# Patient Record
Sex: Male | Born: 1990 | Hispanic: No | Marital: Single | Smoking: Former smoker
Health system: Southern US, Community
[De-identification: ages and names within clinical notes are randomized; demographics above are authoritative.]

## PROBLEM LIST (undated history)

## (undated) DIAGNOSIS — B353 Tinea pedis: Secondary | ICD-10-CM

## (undated) DIAGNOSIS — L218 Other seborrheic dermatitis: Secondary | ICD-10-CM

## (undated) DIAGNOSIS — R200 Anesthesia of skin: Secondary | ICD-10-CM

## (undated) DIAGNOSIS — L738 Other specified follicular disorders: Secondary | ICD-10-CM

## (undated) DIAGNOSIS — R202 Paresthesia of skin: Secondary | ICD-10-CM

## (undated) HISTORY — DX: Other specified follicular disorders: L73.8

## (undated) HISTORY — DX: Tinea pedis: B35.3

## (undated) HISTORY — DX: Other seborrheic dermatitis: L21.8

## (undated) HISTORY — DX: Paresthesia of skin: R20.2

## (undated) HISTORY — DX: Anesthesia of skin: R20.0

---
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
## 2011-03-13 ENCOUNTER — Ambulatory Visit: Payer: Self-pay | Admitting: Family Medicine

## 2011-03-15 ENCOUNTER — Ambulatory Visit: Payer: Self-pay | Admitting: Family Medicine

## 2011-03-22 ENCOUNTER — Ambulatory Visit: Payer: Self-pay | Admitting: Family Medicine

## 2011-04-08 LAB — CBC
HCT: 33.8 — ABNORMAL LOW
Platelets: 178
WBC: 8.9

## 2011-04-08 LAB — URINALYSIS, ROUTINE W REFLEX MICROSCOPIC
Glucose, UA: NEGATIVE
Hgb urine dipstick: NEGATIVE
Ketones, ur: NEGATIVE
Protein, ur: NEGATIVE
pH: 6

## 2011-04-08 LAB — DIFFERENTIAL
Lymphocytes Relative: 10 — ABNORMAL LOW
Lymphs Abs: 0.9 — ABNORMAL LOW
Neutro Abs: 7.8
Neutrophils Relative %: 88 — ABNORMAL HIGH

## 2011-04-08 LAB — COMPREHENSIVE METABOLIC PANEL
ALT: 33
AST: 38 — ABNORMAL HIGH
Albumin: 3.5
CO2: 24
Chloride: 107
Potassium: 4.1
Sodium: 136
Total Bilirubin: 0.9

## 2011-04-08 LAB — MISCELLANEOUS TEST

## 2011-04-08 LAB — GLUCOSE, CAPILLARY

## 2011-09-06 ENCOUNTER — Encounter: Payer: Self-pay | Admitting: Family Medicine

## 2011-09-12 ENCOUNTER — Ambulatory Visit (INDEPENDENT_AMBULATORY_CARE_PROVIDER_SITE_OTHER): Payer: Medicaid Other | Admitting: Family Medicine

## 2011-09-12 ENCOUNTER — Encounter: Payer: Self-pay | Admitting: Family Medicine

## 2011-09-12 ENCOUNTER — Telehealth: Payer: Self-pay | Admitting: Family Medicine

## 2011-09-12 VITALS — BP 110/70 | HR 72 | Ht 63.0 in | Wt 150.9 lb

## 2011-09-12 DIAGNOSIS — L709 Acne, unspecified: Secondary | ICD-10-CM

## 2011-09-12 DIAGNOSIS — L708 Other acne: Secondary | ICD-10-CM

## 2011-09-12 NOTE — Telephone Encounter (Signed)
PT CAME IN AND STATED THAT HE WAS REFERRED TO THE WRONG DERMATOLOGIST. STATES HE WOULD LIKE AN REFERRAL SENT TO [facility] [phone]. WOULD PREFER A Friday AM APPT IF POSSIBLE. PLEASE CALL PT WITH ANY OTHER QUESTIONS.

## 2011-09-12 NOTE — Telephone Encounter (Signed)
appt with d.anderson 10-04-11 at 10 am. Called pt and spoke with pt's mom. Informed of the appt and she will let him know. Also advised to arrive 15 min. Before appt. Lorenda Hatchet, Renato Battles

## 2011-09-12 NOTE — Progress Notes (Signed)
  Subjective:    Patient ID: Brian Cross, male    DOB: 04-07-1991, 21 y.o.   MRN: [id number]  HPI Patient presents today with chief complaint of acne recurrence. Patient states he was previously treated for this back in high school with Accutane. Prior to this patient was treated on medication such as tetracycline and benzoyl peroxide which were not effective. Patient states Accutane was very effective however he was also heavily drinking at the time and patient states that he had to stop medication because his accutane levels came  back to high. Patient states after the medication was discontinued at acne return in a much more aggressive  fashion. Patient is now sophomore college and generally feels that the acne is been very debilitating. Patient states that he relies air in his ways and like to cut back on Accutane again. Patient states that he is no longer drinking.   Review of Systems See HPI, otherwise ROS negative.     Objective:   Physical Exam Gen: in bed, NAD SKIN: Diffuse erythematous papules and pustules across cheeks and forehead.        Assessment & Plan:

## 2011-09-13 NOTE — Assessment & Plan Note (Signed)
Formal referral made back to Derm to assess this issue.

## 2011-10-01 ENCOUNTER — Telehealth: Payer: Self-pay | Admitting: Family Medicine

## 2011-10-01 NOTE — Telephone Encounter (Signed)
Called pt's dad and informed, that I have faxed 4 immunization records for him. Confirmed the number. Told him, that I will also mail the records to him. Lorenda Hatchet, Renato Battles

## 2011-10-01 NOTE — Telephone Encounter (Signed)
Needs shot records for all his children to take for his taxes - needs them faxed to [phone]   Also for: Brian Cross - dob 10/15/00 Brian Cross- dob 09/11/01 Brian Cross - dob 07/30/10

## 2012-03-04 ENCOUNTER — Encounter: Payer: Self-pay | Admitting: Family Medicine

## 2012-03-04 ENCOUNTER — Ambulatory Visit (INDEPENDENT_AMBULATORY_CARE_PROVIDER_SITE_OTHER): Payer: Medicaid Other | Admitting: Family Medicine

## 2012-03-04 VITALS — BP 130/84 | HR 73 | Ht 63.0 in | Wt 158.0 lb

## 2012-03-04 DIAGNOSIS — E785 Hyperlipidemia, unspecified: Secondary | ICD-10-CM

## 2012-03-04 DIAGNOSIS — R7989 Other specified abnormal findings of blood chemistry: Secondary | ICD-10-CM

## 2012-03-04 LAB — COMPREHENSIVE METABOLIC PANEL
ALT: 36 U/L (ref 0–53)
BUN: 14 mg/dL (ref 6–23)
CO2: 26 mEq/L (ref 19–32)
Calcium: 9.6 mg/dL (ref 8.4–10.5)
Chloride: 106 mEq/L (ref 96–112)
Creat: 0.86 mg/dL (ref 0.50–1.35)
Total Bilirubin: 0.4 mg/dL (ref 0.3–1.2)

## 2012-03-04 LAB — LIPID PANEL
Cholesterol: 214 mg/dL — ABNORMAL HIGH (ref 0–200)
HDL: 33 mg/dL — ABNORMAL LOW (ref >39)
Triglycerides: 450 mg/dL — ABNORMAL HIGH (ref <150)

## 2012-03-04 NOTE — Patient Instructions (Addendum)
Thank you for coming in today, it was good to see you I am going to re check your lab work today. If this looks good we will send over a referral to [facility]. I will let you know when I get lab results back.  I would abstain from alcohol intake for now given your history of elevated liver enzymes.

## 2012-03-09 DIAGNOSIS — R7989 Other specified abnormal findings of blood chemistry: Secondary | ICD-10-CM | POA: Insufficient documentation

## 2012-03-09 NOTE — Assessment & Plan Note (Signed)
Check lipid profile today.  If elevated will likely need to begin tx.

## 2012-03-09 NOTE — Progress Notes (Signed)
  Subjective:    Patient ID: Brian Cross, male    DOB: 07-17-1990, 21 y.o.   MRN: [id number]  HPI Was asked to follow up with our clinic by dermatologist due to   1. Abnormal LFT's:  Patient states that liver enzymes were abnormal on last labwork done by Roxan Hockey.  Was planning on starting back on accutane and that is why labs were drawn.  He does have a hx of elevated LFT's in the past with negative hepatitis serology.  He does endorse to heavy EtOH intake during his time in high school but is not drinking much now.  He does eat a large variety of fatty foods.  He denies any history of iv drug use.  Has not noticed any jaundice or yellowing of eyes.    2. HLD:  History of dyslipidemia, most notably hypertriglyceridemia.  As above history of EtOH use and poor diet.  He has never been on medication for this in the past.   Review of Systems Per HPI    Objective:   Physical Exam  Constitutional:       Young male, nad    Neck: Neck supple. No thyromegaly present.  Cardiovascular: Normal rate and regular rhythm.   Pulmonary/Chest: Effort normal and breath sounds normal.  Abdominal: Soft. Bowel sounds are normal. He exhibits no distension. There is no tenderness.       No hepatomegaly.   Musculoskeletal: He exhibits no edema.  Neurological: He is alert.          Assessment & Plan:

## 2012-03-09 NOTE — Assessment & Plan Note (Signed)
History of elevated LFT"s.  States that recent labwork at derm office showed elevated liver enzymes.  Recheck today.  May have been transient related to EtOH use or fatty liver from elevated triglycerides.  Viral hepatitis serology done in April at derm office negative for HeP A, B, C

## 2012-03-13 ENCOUNTER — Encounter: Payer: Self-pay | Admitting: Family Medicine

## 2012-03-27 ENCOUNTER — Other Ambulatory Visit: Payer: Self-pay | Admitting: Family Medicine

## 2012-03-27 ENCOUNTER — Encounter: Payer: Self-pay | Admitting: Family Medicine

## 2012-03-27 ENCOUNTER — Ambulatory Visit (INDEPENDENT_AMBULATORY_CARE_PROVIDER_SITE_OTHER): Payer: Medicaid Other | Admitting: Family Medicine

## 2012-03-27 VITALS — BP 125/74 | HR 61 | Ht 63.0 in | Wt 158.2 lb

## 2012-03-27 DIAGNOSIS — L738 Other specified follicular disorders: Secondary | ICD-10-CM

## 2012-03-27 DIAGNOSIS — E781 Pure hyperglyceridemia: Secondary | ICD-10-CM | POA: Insufficient documentation

## 2012-03-27 HISTORY — DX: Other specified follicular disorders: L73.8

## 2012-03-27 MED ORDER — CLINDAMYCIN PHOS-BENZOYL PEROX 1-5 % EX GEL: Freq: Two times a day (BID) | CUTANEOUS | Status: DC

## 2012-03-27 MED ORDER — GEMFIBROZIL 600 MG PO TABS: 600.0000 mg | ORAL_TABLET | Freq: Two times a day (BID) | ORAL | Status: DC

## 2012-03-30 NOTE — Progress Notes (Signed)
  Subjective:    Patient ID: Brian Cross, male    DOB: 02/13/1991, 21 y.o.   MRN: [id number]  HPI 1. F/u labs:  Her to f/u labwork.  Previous labs showed significant elevation in triglycerides.  These have been elevated for the past two years.  Currently not on any medication.  Patient feels like his diet is generally good. He denies eating an abundance of high fat foods. He does not get regular exercise.  He does endorse mild alcohol intake, he used to drink much more a couple of years ago.  Denies increased urination or thirst.     Review of Systems Per HPI    Objective:   Physical Exam  Constitutional: He appears well-nourished. No distress.  HENT:  Head: Normocephalic and atraumatic.  Neurological: He is alert.          Assessment & Plan:

## 2012-03-30 NOTE — Assessment & Plan Note (Signed)
Cholesterol elevated but I think this is due to his moderate hypertriglyceridemia.  Will start lopid and have him return in a few months to recheck triglycerides.  Discussed how he can improve this on his on with exercise and continue to improve his diet.

## 2013-01-29 ENCOUNTER — Ambulatory Visit (INDEPENDENT_AMBULATORY_CARE_PROVIDER_SITE_OTHER): Payer: Medicaid Other | Admitting: Family Medicine

## 2013-01-29 ENCOUNTER — Encounter: Payer: Self-pay | Admitting: Family Medicine

## 2013-01-29 VITALS — BP 130/84 | HR 77 | Ht 63.0 in | Wt 161.0 lb

## 2013-01-29 DIAGNOSIS — Z00129 Encounter for routine child health examination without abnormal findings: Secondary | ICD-10-CM

## 2013-01-29 DIAGNOSIS — L708 Other acne: Secondary | ICD-10-CM

## 2013-01-29 DIAGNOSIS — B353 Tinea pedis: Secondary | ICD-10-CM | POA: Insufficient documentation

## 2013-01-29 DIAGNOSIS — E781 Pure hyperglyceridemia: Secondary | ICD-10-CM

## 2013-01-29 DIAGNOSIS — E785 Hyperlipidemia, unspecified: Secondary | ICD-10-CM

## 2013-01-29 DIAGNOSIS — Z7189 Other specified counseling: Secondary | ICD-10-CM

## 2013-01-29 DIAGNOSIS — F17200 Nicotine dependence, unspecified, uncomplicated: Secondary | ICD-10-CM

## 2013-01-29 DIAGNOSIS — Z716 Tobacco abuse counseling: Secondary | ICD-10-CM

## 2013-01-29 HISTORY — DX: Tinea pedis: B35.3

## 2013-01-29 LAB — COMPREHENSIVE METABOLIC PANEL
AST: 40 U/L — ABNORMAL HIGH (ref 0–37)
Albumin: 4.2 g/dL (ref 3.5–5.2)
Alkaline Phosphatase: 97 U/L (ref 39–117)
Potassium: 4.2 mEq/L (ref 3.5–5.3)
Sodium: 138 mEq/L (ref 135–145)
Total Protein: 7.5 g/dL (ref 6.0–8.3)

## 2013-01-29 LAB — LIPID PANEL
Total CHOL/HDL Ratio: 7.2 Ratio
VLDL: 73 mg/dL — ABNORMAL HIGH (ref 0–40)

## 2013-01-29 MED ORDER — CLOTRIMAZOLE-BETAMETHASONE 1-0.05 % EX CREA: TOPICAL_CREAM | Freq: Two times a day (BID) | CUTANEOUS | Status: DC

## 2013-01-29 NOTE — Patient Instructions (Addendum)
Thank you for coming in, today! For your cholesterol:    We will check your labs today.    I will call you and we may start a medication, then we may recheck it to see how any meds are working    Once that's done, we will get you re-referred to dermatology. For your foot:    I think this is something called tinea pedis (athlete's foot).    I will prescribe a medication called lotrisone cream. Use it twice per day until it goes away.    After that, use it once a week. For smoking:    Stopping smoking will help with lots of stuff -- it's better your heart, your lungs, your skin, and it may help with your cholesterol.    You can call 1-800-QUIT NOW, absolutely free, for advice and help stopping smoking.    I will also talk to our health coaches, who can call you and talk about different strategies.    If you would like to talk about medications to help, please give me a call. Come back to see me in about 1-2 months. Please feel free to call with any questions or concerns at any time, at [phone]. --Dr. Casper Harrison

## 2013-02-01 NOTE — Progress Notes (Signed)
  Subjective:    Patient ID: Brian Cross, male    DOB: 29-Apr-1991, 22 y.o.   MRN: [id number]  HPI: Pt presents for yearly visit and to discuss acne as well as questionable athlete's foot. Generally feels well. Pt has previously been treated by Drs. Tafeen and Lupton (derm) but has had to stop Accutane due to high cholesterol/TG. Has not seen derm in a few years.  Acne: Extensive and long-standing, previously on Accutane but stopped a few years ago due to high cholesterol/TG/impaired liver function  -pt has been on several different medications in the past but not taking anything currently  -interested in following up with dermatology again, but would like to check lipids/TG's and have medications if necessary before being re-referred  -some occasional itching of lesions, but no frank drainage or bleeding from lesions; pt very interested in treatment for cosmesis at least  Athlete's foot: Pt complains of itching and redness to webspaces between 3rd-4th and 4th-5th digits of his left foot; similar on his right foot but less extensive  -itching, redness, pain in the area, present for several days and getting worse despite using Lotrisone cream OTC  -no frank bleeding or discharge; pt has had athlete's foot in the past and thinks this is similar  Elevated cholesterol/triglycerides: Per history; last lipid panel "at least a year ago"  -pt previously prescribed Lopid but states he never took this medication because he was "never told to pick it up"  -never on statin or other medication, not currently taking anything over the counter such as fish oil or niacin  -as above, pt believes this is why he was stopped on Accutane for acne  Pt is a current smoker. He is interested in quitting. In addition to the above documentation, pt's PMH, surgical history, FH, and SH all reviewed and updated where appropriate in the EMR. I have also reviewed and updated the pt's allergies and current medications as  appropriate.  Review of Systems: As above. Otherwise, full 12-system ROS was reviewed and all negative.     Objective:   Physical Exam BP 130/84  Pulse 77  Ht 5\' 3"  (1.6 m)  Wt 161 lb (73.029 kg)  BMI 28.53 kg/m2 Gen: well-appearing young adult male in NAD HEENT: [place]/AT, sclerae/conjunctivae clear, no lid lag, EOMI, PERRLA   MMM, posterior oropharynx clear, no cervical lymphadenopathy  neck supple with full ROM, no masses appreciated; thyroid not enlarged  Diffuse whitish papular lesions to bilateral cheeks, forehead, and across nose consistent with prior documentation of severe acne  Few very small similar lesions to posterior scalp just superior to hairline, no frank drainage or bleeding Ext: bilateral feet with scaling skin and mild erythematous skin breakdown to webspaces between toes, worse on left between 3rd and 4th toes  No frank bleeding or drainage, similar appearance but less severe on right between 2nd and 3rd toes  General appearance consistent with tinea pedis Cardio: RRR, no murmur appreciated; distal pulses intact/symmetric Pulm: CTAB, no wheezes, normal WOB  Abd: soft, nondistended, BS+, no HSM Ext: warm/well-perfused, no cyanosis/clubbing/edema MSK: strength 5/5 in all four extremities, no frank joint deformity/effusion;   normal ROM to all four extremities with no point muscle/bony tenderness in spine Neuro/Psych: alert/oriented, sensation grossly intact; normal gait/balance  mood euthymic with congruent affect     Assessment & Plan:

## 2013-02-02 ENCOUNTER — Telehealth: Payer: Self-pay | Admitting: Family Medicine

## 2013-02-02 DIAGNOSIS — Z716 Tobacco abuse counseling: Secondary | ICD-10-CM | POA: Insufficient documentation

## 2013-02-02 DIAGNOSIS — E781 Pure hyperglyceridemia: Secondary | ICD-10-CM

## 2013-02-02 MED ORDER — GEMFIBROZIL 600 MG PO TABS: 600.0000 mg | ORAL_TABLET | Freq: Two times a day (BID) | ORAL | Status: DC

## 2013-02-02 NOTE — Telephone Encounter (Signed)
Called pt to discuss recent lab draw. Liver function notable only for very mild increased AST (40). TG 367 and LDL 100. Pt had been prescribed Lopid 600 mg BID in the past but has never taken this medication. Will reorder this and plan to recheck cholesterol and liver function in 2-3 months. If lipids improved will likely re-refer to dermatology for specialist management of acne (pt reported that he was stopped on medication from dermatology due to problems with liver function/TG's/cholesterol). --CMS

## 2013-02-03 NOTE — Telephone Encounter (Signed)
Pt is over 18, so he must provide permission to disclose information about his healthcare to pt's mother (I discussed this with pt's mother at a visit she was present for, for pt's younger brother; I did not specifically ask him yesterday if we could disclose information to his mother). Pt reported to me that he was seen by dermatology in the past but was stopped on Accutane due to elevated transaminases and high triglycerides/cholesterol. Pt still has very mildly elevated AST and TG's, and has been prescribed Lopid. Plan now is to wait to see how his liver function and lipid panel looks on medication in several weeks, then refer to derm (instead of referring him now, when they may want to wait to see resolution of liver function, etc, anyway). This was all discussed with pt by phone, yesterday with me. Thanks! --CMS

## 2013-02-03 NOTE — Telephone Encounter (Signed)
Patient's mother here in office and requested copy of recent labs.  Copy given.  Mother also wants to check on status of dermatology referral.  Will route note to Dr. Casper Harrison and call mother back.  Gaylene Brooks, RN

## 2013-02-03 NOTE — Assessment & Plan Note (Signed)
Mild/moderate to bilateral feet webspaces, left worse than right. Rx for Lotrisone cream. Counseled on daily foot hygiene. Follow up PRN.

## 2013-02-03 NOTE — Assessment & Plan Note (Signed)
Last lipid panel about 1 year ago, TG 450, LDL not calculated, with normal LFT's. CMP and lipid panel drawn at this visit shows very mild AST elevation at 40, LDL 100, TG 367. Rx given for Lopid to treat hypertriglyceridemia. Will plan to recheck CMP and lipid panel after medication is started for several weeks, then f/u as needed.

## 2013-02-03 NOTE — Assessment & Plan Note (Signed)
Severe and chronic in nature, last seen by derm >1 year ago. Previously on Accutane, stopped due to liver function and elevated TG/cholesterol. Not on any current medications. No evidence currently for superinfection. Will likely end up referring back to dermatology once liver function/TG/cholesterol is addressed. See also dyslipidemia problem list note.

## 2013-02-03 NOTE — Assessment & Plan Note (Signed)
Current daily smoker, interested in quitting. Counseled on general cessation strategy and referred to [facility] QUIT NOW number. Will also refer to health coach; pt in agreement to these measures. Will f/u with further counseling at next visit(s).

## 2013-02-11 ENCOUNTER — Telehealth: Payer: Self-pay | Admitting: Family Medicine

## 2013-02-11 DIAGNOSIS — E785 Hyperlipidemia, unspecified: Secondary | ICD-10-CM

## 2013-02-11 NOTE — Telephone Encounter (Signed)
Mr. Makarewicz called to ask if his cholesterol med could be change to Lovaza or Crestor because the are cheaper than the Gemfibrozil that was prescribed.  Can send to rx request to [facility] on [address]. Patient requesting call first before sending medication to pharmacy. Would like call by 10:00 in the am.

## 2013-02-11 NOTE — Telephone Encounter (Signed)
Forward to PCP to change Rx 

## 2013-02-12 MED ORDER — ATORVASTATIN CALCIUM 40 MG PO TABS: 40.0000 mg | ORAL_TABLET | Freq: Every day | ORAL | Status: DC

## 2013-02-12 NOTE — Telephone Encounter (Signed)
Called pt this morning to discuss medication changes. Crestor is certainly an option for him, but is not preferred for Medicaid. Lovaza could potentially be an option as well, but is not a preferred drug for Medicaid, either; additionally, Lovaza is more appropriate for TG >500, which pt does not have, though his TG's have been >450 in the past. After discussion with pt, Rx was sent in for generic Lipitor 40 mg. Advised pt to call back today if he has any issues with Rx, to hopefully address things before the weekend. Otherwise advised him to call first thing Monday if there are any problems. --CMS

## 2013-02-19 ENCOUNTER — Telehealth: Payer: Self-pay | Admitting: *Deleted

## 2013-02-19 NOTE — Telephone Encounter (Signed)
Uncertain why there is difficulty covering Lipitor generic (this is on the Medicaid preferred list). Prior auth completed, citing unique pt circumstances (elevated LDL and TG's, making Lipitor the most appropriate medication compared to Lopid). If this is not accepted, will consider trying to get pt in to [facility]. --CMS

## 2013-02-19 NOTE — Telephone Encounter (Signed)
Can you prescribe alternative to atorvastatin - insurance will not cover? Wyatt Haste, RN-BSN

## 2013-02-22 ENCOUNTER — Telehealth: Payer: Self-pay | Admitting: *Deleted

## 2013-02-22 NOTE — Telephone Encounter (Signed)
[place] [facility] called for prior authorization on atorvastin - pt only has [employer] MEDICAID. No drugs unless std disease realted will be covered. Pt called and informed - message left with mother. Wyatt Haste, RN-BSN

## 2014-05-01 ENCOUNTER — Emergency Department (HOSPITAL_COMMUNITY): Payer: BC Managed Care – PPO

## 2014-05-01 ENCOUNTER — Encounter (HOSPITAL_COMMUNITY): Payer: Self-pay | Admitting: Emergency Medicine

## 2014-05-01 ENCOUNTER — Emergency Department (HOSPITAL_COMMUNITY)
Admission: EM | Admit: 2014-05-01 | Discharge: 2014-05-01 | Disposition: A | Discharge status: Home / Self Care | Payer: BC Managed Care – PPO | Attending: Emergency Medicine | Admitting: Emergency Medicine

## 2014-05-01 DIAGNOSIS — M79641 Pain in right hand: Secondary | ICD-10-CM

## 2014-05-01 DIAGNOSIS — Y9289 Other specified places as the place of occurrence of the external cause: Secondary | ICD-10-CM | POA: Insufficient documentation

## 2014-05-01 DIAGNOSIS — Y9389 Activity, other specified: Secondary | ICD-10-CM | POA: Diagnosis not present

## 2014-05-01 DIAGNOSIS — Z79899 Other long term (current) drug therapy: Secondary | ICD-10-CM | POA: Insufficient documentation

## 2014-05-01 DIAGNOSIS — Z720 Tobacco use: Secondary | ICD-10-CM | POA: Diagnosis not present

## 2014-05-01 DIAGNOSIS — S6991XA Unspecified injury of right wrist, hand and finger(s), initial encounter: Secondary | ICD-10-CM | POA: Diagnosis present

## 2014-05-01 DIAGNOSIS — Z7952 Long term (current) use of systemic steroids: Secondary | ICD-10-CM | POA: Insufficient documentation

## 2014-05-01 DIAGNOSIS — X58XXXA Exposure to other specified factors, initial encounter: Secondary | ICD-10-CM | POA: Insufficient documentation

## 2014-05-01 MED ORDER — IBUPROFEN 800 MG PO TABS
800.0000 mg | ORAL_TABLET | Freq: Once | ORAL | Status: AC
  Administered 2014-05-01: 800 mg via ORAL
  Filled 2014-05-01: qty 1

## 2014-05-01 MED ORDER — NAPROXEN 500 MG PO TABS: 500.0000 mg | ORAL_TABLET | Freq: Two times a day (BID) | ORAL | Status: DC

## 2014-05-01 MED ORDER — HYDROCODONE-ACETAMINOPHEN 5-325 MG PO TABS: 1.0000 | ORAL_TABLET | Freq: Four times a day (QID) | ORAL | Status: DC | PRN

## 2014-05-01 NOTE — ED Provider Notes (Signed)
Medical screening examination/treatment/procedure(s) were performed by non-physician practitioner and as supervising physician I was immediately available for consultation/collaboration.   EKG Interpretation None      Devoria AlbeIva Dyani Babel, MD, Armando GangFACEP   Ward GivensIva L Keymarion Bearman, MD 05/01/14 2024
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
## 2014-05-01 NOTE — Discharge Instructions (Signed)
Intermetacarpal Sprain °The intermetacarpal ligaments run between the knuckles at the base of the fingers. These ligaments are vulnerable to sprain and injury in which the ligament becomes overstretched or torn. Intermetacarpal sprains are classified into 3 categories. Grade 1 sprains cause pain, but the tendon is not lengthened. Grade 2 sprains include a lengthened ligament, due to the ligament being stretched or partially ruptured. With grade 2 sprains there is still function, although function may be decreased. Grade 3 sprains include a complete tear of the ligament, and the joint usually displays a loss of function.  °SYMPTOMS  °· Severe pain at the time of injury. °· Often, a feeling of popping or tearing inside the hand. °· Tenderness and inflammation at the knuckles. °· Bruising within a couple days of injury. °· Impaired ability to use the hand. °CAUSES  °This condition occurs when the intermetacarpal ligaments are subjected to a greater stress than they can handle. This causes the ligaments to become stretched or torn. °RISK INCREASES WITH: °· Previous hand injury. °· Fighting sports (boxing, wrestling, martial arts). °· Sports in which you could fall on an outstretched hand (soccer, basketball, volleyball). °· Other sports with repeated hand trauma (water polo, gymnastics). °· Poor hand strength and flexibility. °· Inadequate or poorly fitted protective equipment. °PREVENTION  °· Warm up and stretch properly before activity. °· Maintain appropriate conditioning: °¨ Hand flexibility. °¨ Muscle strength and endurance. °· Applying tape, protective strapping, or a brace may help prevent injury. °· Provide the hand with support during sports and practice activities for 6 to 12 months following injury. °PROGNOSIS  °With proper treatment, healing should occur without impairment. The length of healing varies from 2 to 12 weeks, depending on the severity of injury. °RELATED COMPLICATIONS  °· Longer healing time, if  activities are resumed too soon. °· Recurring symptoms or repeated injury, resulting in a chronic problem. °· Injury to other nearby structures (bone, cartilage, tendon). °· Arthritis of the knuckle (intermetacarpal) joint, with repeated sprains. °· Prolonged disability (sometimes). °· Hand and finger stiffness or weakness. °TREATMENT °Treatment first involves ice and medicine to reduce pain and inflammation. An elastic compression bandage may be worn to reduce discomfort and to protect the area. Depending on the severity of injury, you may be required to restrain the area with a cast, splint, or brace. After the ligament has been allowed to heal, strengthening and stretching exercises may be needed to regain strength and a full range of motion. Exercises may be completed at home or with a therapist. Surgery is rarely needed. °MEDICATION  °· If pain medicine is needed, nonsteroidal anti-inflammatory medicines (aspirin and ibuprofen), or other minor pain relievers (acetaminophen), are often advised. °· Do not take pain medicine for 7 days before surgery. °· Stronger pain relievers may be prescribed if your caregiver thinks they are needed. Use only as directed and only as much as you need. °HEAT AND COLD °· Cold treatment (icing) should be applied for 10 to 15 minutes every 2 to 3 hours for inflammation and pain, and immediately after activity that aggravates your symptoms. Use ice packs or an ice massage. °· Heat treatment may be used before performing stretching and strengthening activities prescribed by your caregiver, physical therapist, or athletic trainer. Use a heat pack or a warm water soak. °SEEK MEDICAL CARE IF:  °· Symptoms remain or get worse, despite treatment for longer than 2 to 4 weeks. °· You experience pain, numbness, discoloration, or coldness in the hand or fingers. °·   You develop blue, gray, or dark fingernails. °· Any of the following occur after surgery: increased pain, swelling, redness,  drainage of fluids, bleeding in the affected area, or signs of infection, including fever. °· New, unexplained symptoms develop. (Drugs used in treatment may produce side effects.) °Document Released: 06/24/2005 Document Revised: 11/08/2013 Document Reviewed: 10/06/2008 °[employer]® Patient Information ©2015 [employer]. This information is not intended to replace advice given to you by your health care provider. Make sure you discuss any questions you have with your health care provider. ° °

## 2015-04-12 IMAGING — CR DG WRIST COMPLETE 3+V*R*
4 series · 4 of 4 positions shown · non-contrast
Comparison: None.

CLINICAL DATA: Right wrist injury today.  Pain.  Initial encounter.

EXAM:
RIGHT WRIST - COMPLETE 3+ VIEW

[x wrist pa right]
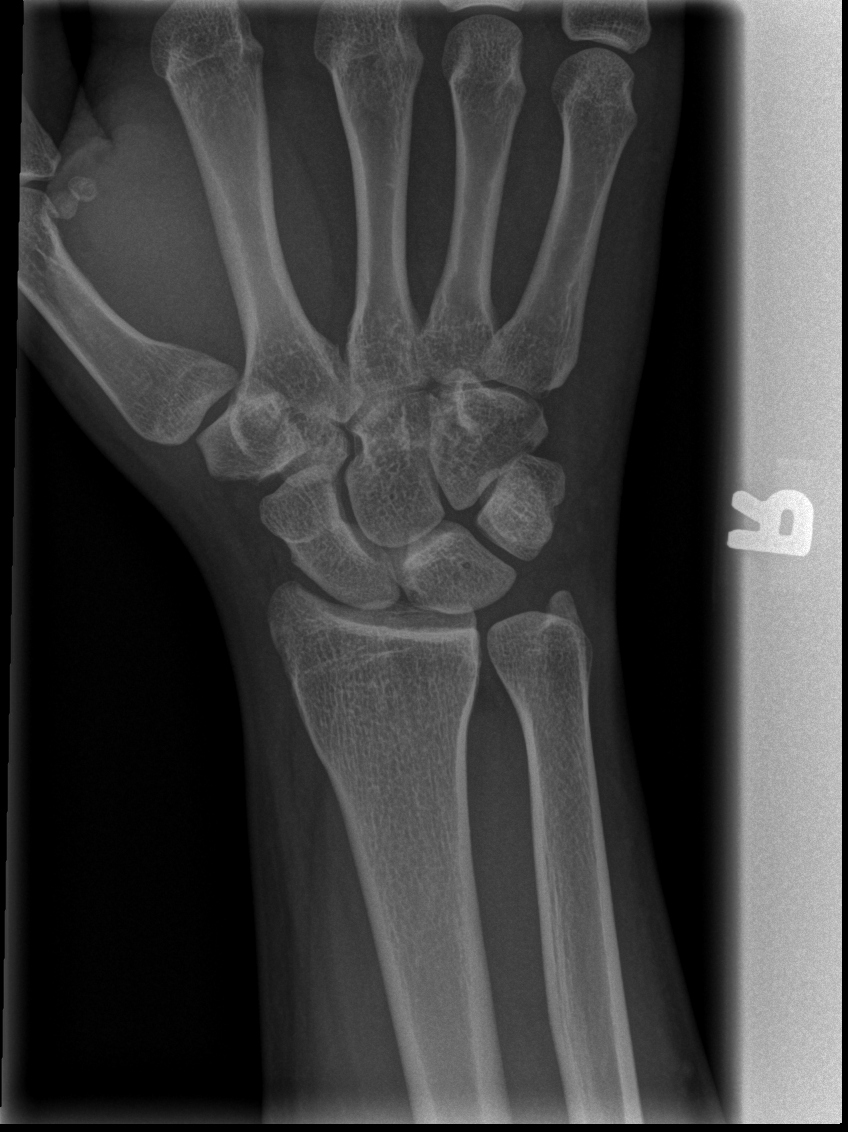

[x wrist obl right]
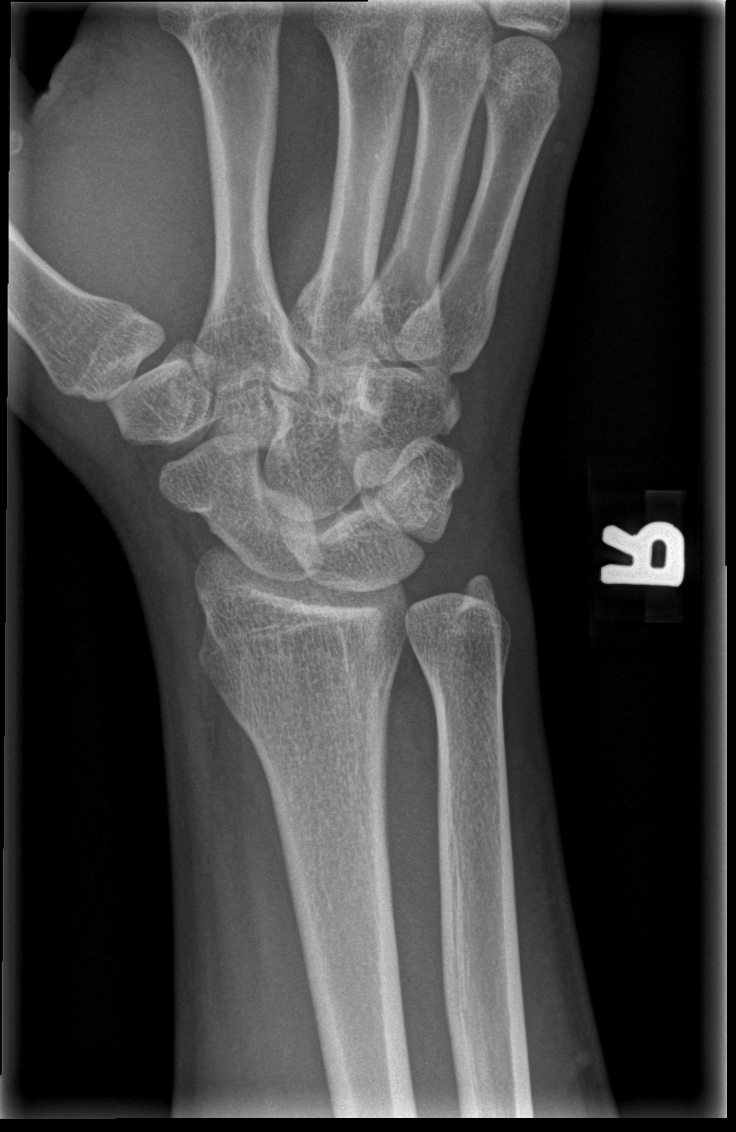

[x wrist lat right]
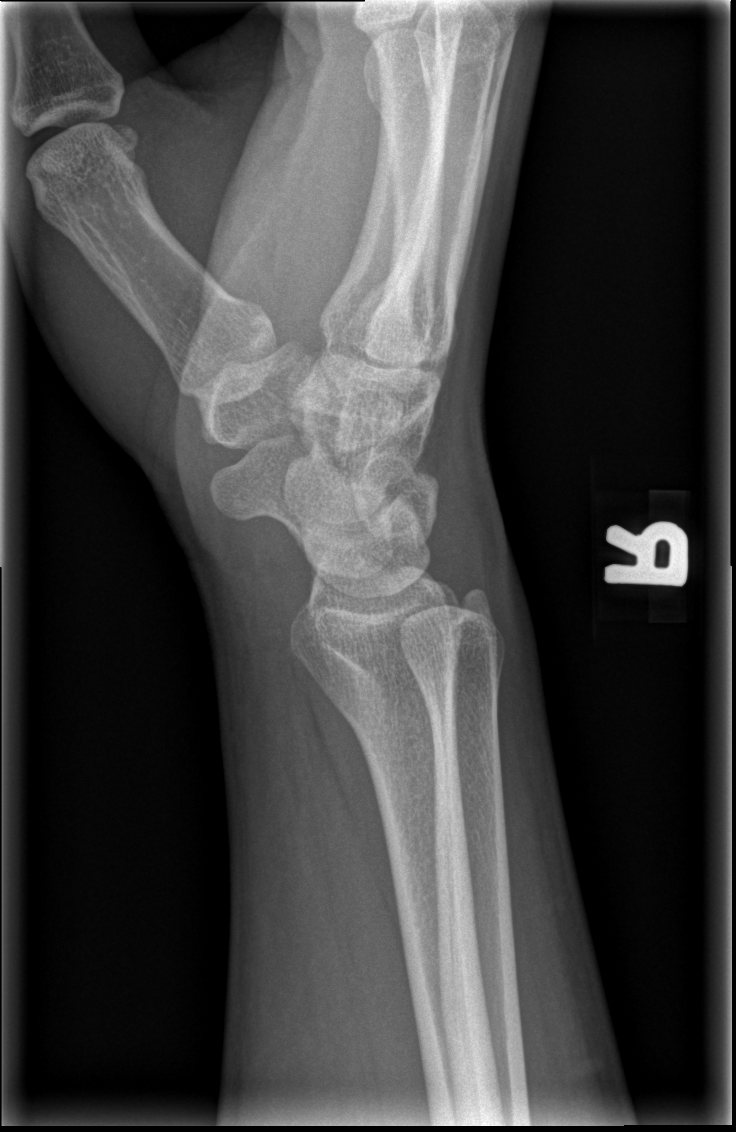

[x wrist navicular view right]
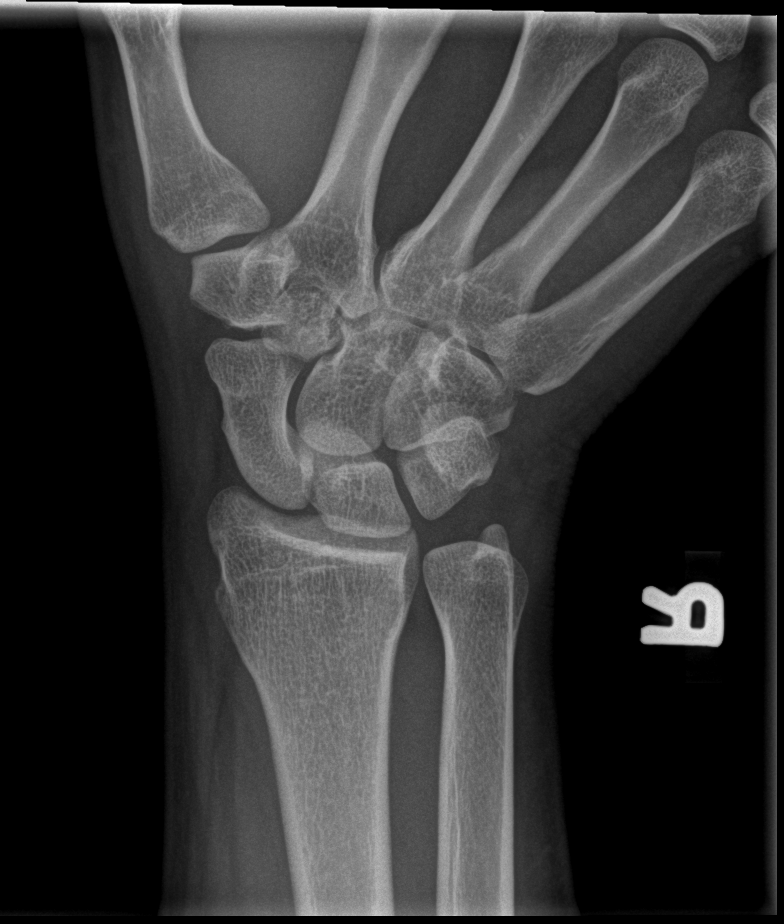

[4 of 4 positions shown; findings below may reference images not displayed]

FINDINGS: Imaged bones, joints and soft tissues appear normal.
IMPRESSION: Normal examination.

## 2015-04-12 IMAGING — CR DG HAND COMPLETE 3+V*R*
3 series · 3 of 3 positions shown · non-contrast
Comparison: None.

CLINICAL DATA: Right hand injury, swelling at base of 1st and 2nd
digits

EXAM:
RIGHT HAND - COMPLETE 3+ VIEW

[x hand pa right]
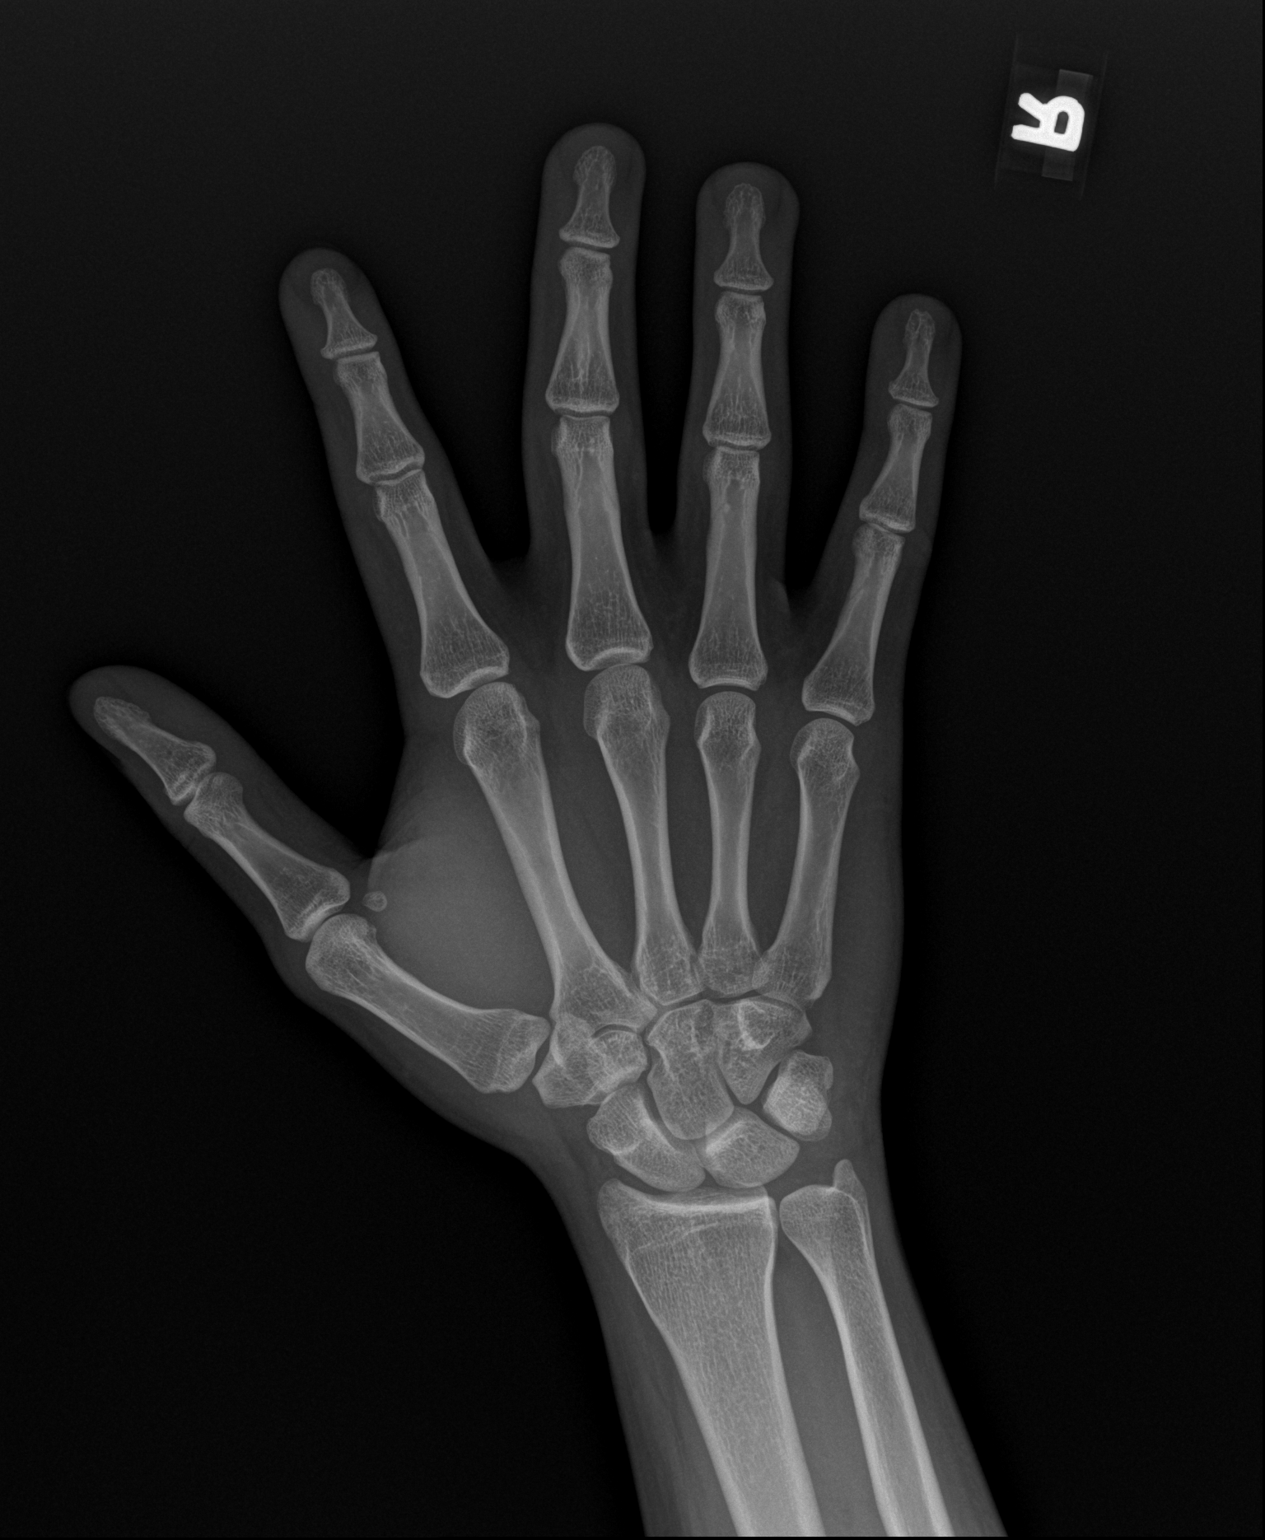

[x hand obl right]
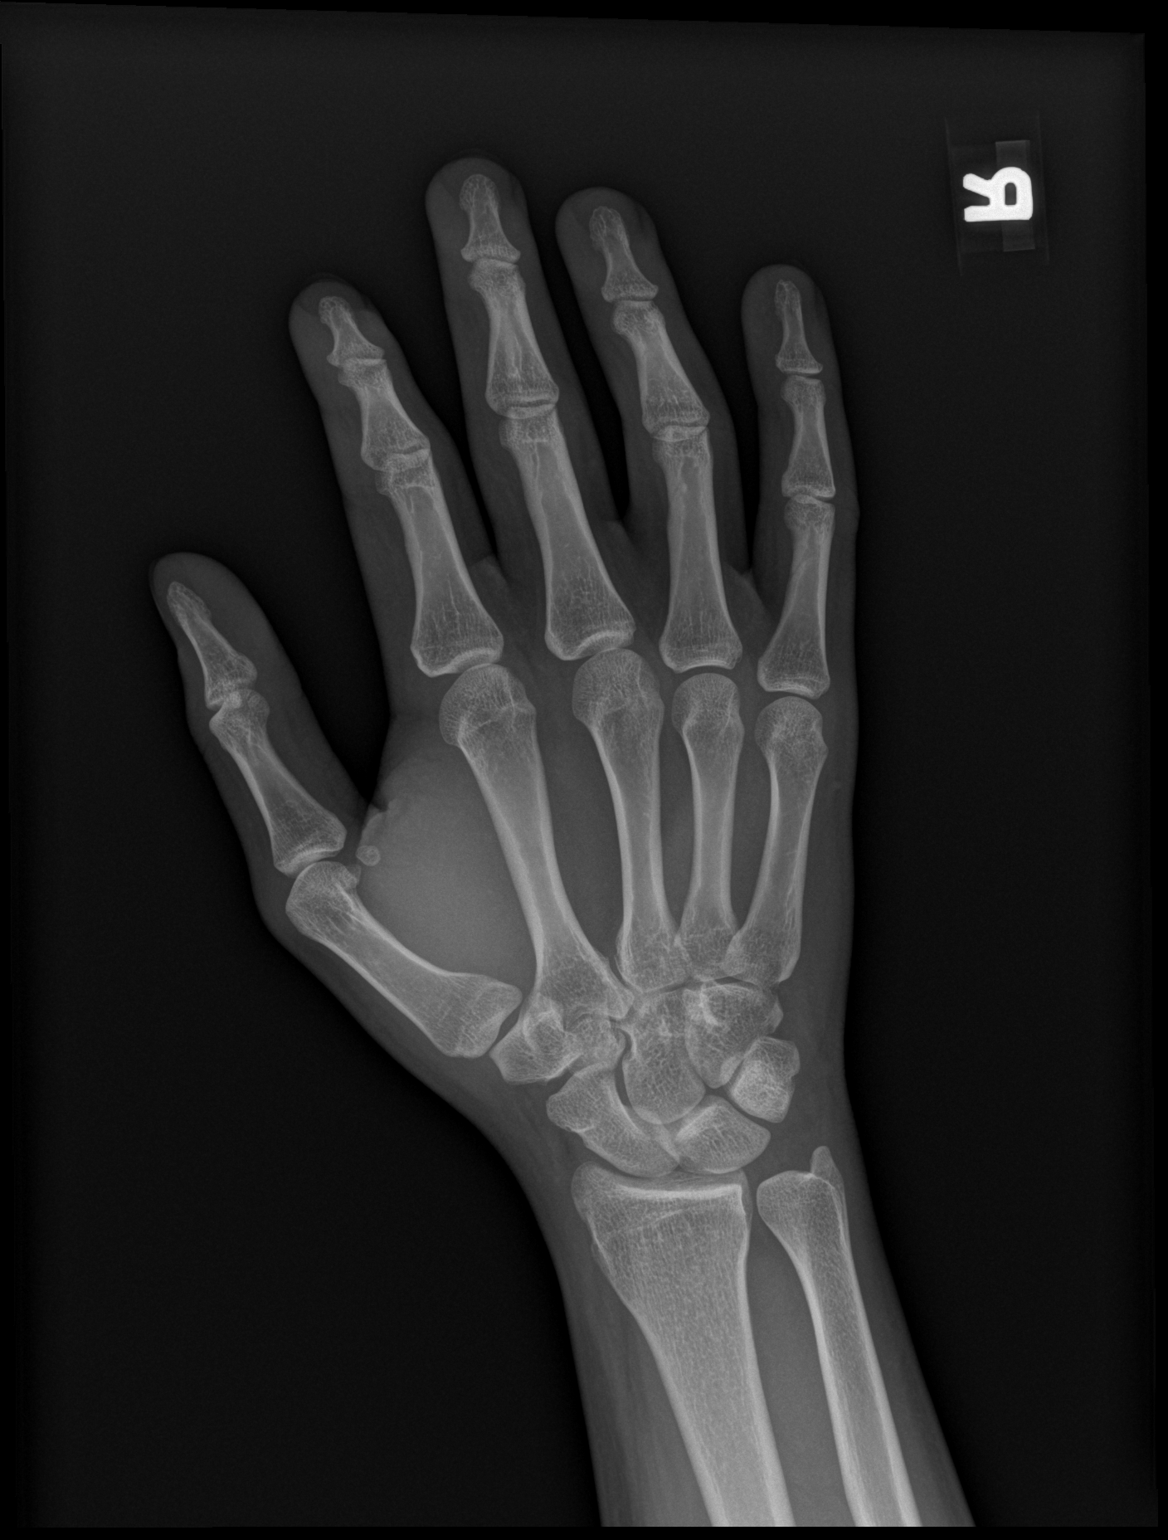

[x hand lat right]
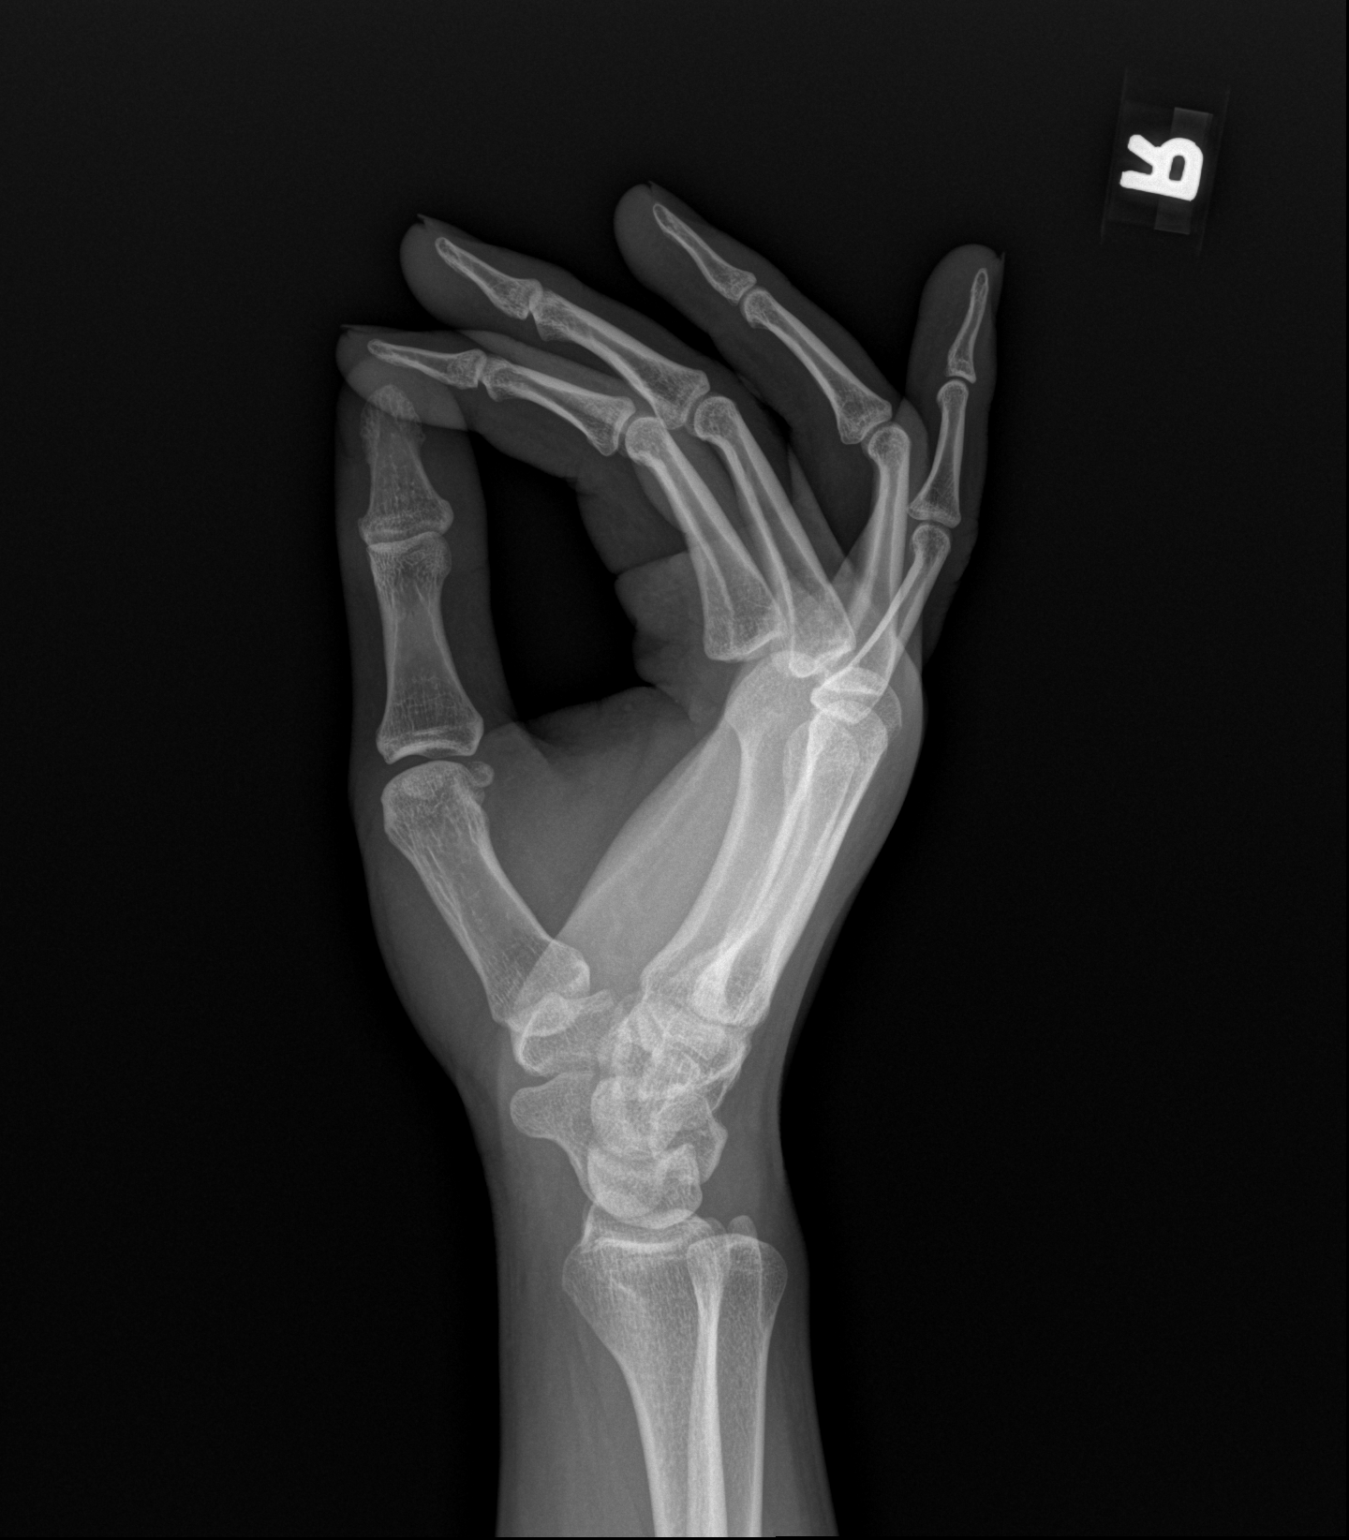

[3 of 3 positions shown; findings below may reference images not displayed]

FINDINGS: No fracture or dislocation is seen.

The joint spaces are preserved.

Mild soft tissue swelling overlying the dorsal MCP joints on the
lateral view.
IMPRESSION: No fracture or dislocation is seen.

## 2015-12-12 ENCOUNTER — Ambulatory Visit: Payer: Medicaid Other

## 2015-12-13 ENCOUNTER — Ambulatory Visit (INDEPENDENT_AMBULATORY_CARE_PROVIDER_SITE_OTHER): Payer: Self-pay | Admitting: Internal Medicine

## 2015-12-13 ENCOUNTER — Encounter: Payer: Self-pay | Admitting: Internal Medicine

## 2015-12-13 VITALS — BP 139/84 | HR 73 | Temp 98.1°F | Wt 137.8 lb

## 2015-12-13 DIAGNOSIS — K219 Gastro-esophageal reflux disease without esophagitis: Secondary | ICD-10-CM | POA: Insufficient documentation

## 2015-12-13 MED ORDER — CALCIUM CARBONATE-SIMETHICONE 500-125 MG PO CHEW: CHEWABLE_TABLET | ORAL | Status: DC

## 2015-12-13 NOTE — Assessment & Plan Note (Addendum)
Symptoms of GERD, associated with long fasts, followed by large food intake. Positive symptoms include radiating chest pain along esophagus. Burping, nausea after eating.  - patient believes associated with eating tomatoes, indicated stopping tomatoes during Ramadan - Prescribed calcuim-carbonate-simethicone tablets as needed for the nausea.  - Patient wanted EKG at this visit, [facility], counseled against this, normal Cardio exam today, no signs or symptoms of arrhthymias

## 2015-12-13 NOTE — Patient Instructions (Addendum)
Try to over the counter medication of reflux as this may help with the nausea

## 2015-12-13 NOTE — Progress Notes (Signed)
   [facility] Brian CharsAsiyah Atleigh Gruen, MD Phone: [phone]  Reason For Visit: Same day visit N/V   # Ramadan, therefore patient has been fasting during the day, eating at night after night fall.  - Indicates after breaking fast having nausea with eating, finds himself burping a lot, will have chest pain associated with this at times. He has vomited up a little about two times. He feels like these symptoms are associated with eating tomatoes, and since he stopped eating tomatoes they have resolved. Patient indicates having diarrhea, - he states his stools are watery. However usually only has 1 stool a day, at the most 2 stools in day. He is worried about his heart due to this chest pain. However, denies any syncopal events or exercise intolerance. No hx of sudden young deaths in the family. Patient is anxious about his health.   Past Medical History Reviewed problem list.  Medications- reviewed and updated No additions to family history Social history- patient is a former smoker  Objective: BP 139/84 mmHg  Pulse 73  Temp(Src) 98.1 F (36.7 C) (Oral)  Wt 137 lb 12.8 oz (62.506 kg) Gen: NAD, alert, cooperative with exam CV: RRR, good S1/S2, no murmur,  Resp: CTABL, no wheezes, non-labored Abd: SNTND, BS present, no guarding  Assessment/Plan: See problem based a/p  GERD (gastroesophageal reflux disease) Symptoms of GERD, associated with long fasts, followed by large food intake. Positive symptoms include radiating chest pain along esophagus. Burping, nausea after eating.  - patient believes associated with eating tomatoes, indicated stopping tomatoes during Ramadan - Prescribed calcuim-carbonate-simethicone tablets as needed for the nausea.  - Patient wanted EKG at this visit, [facility], counseled against this, normal Cardio exam today, no signs or symptoms of arrhthymias

## 2017-08-13 ENCOUNTER — Other Ambulatory Visit: Payer: Self-pay

## 2017-08-13 ENCOUNTER — Ambulatory Visit: Payer: BLUE CROSS/BLUE SHIELD | Admitting: Internal Medicine

## 2017-08-13 ENCOUNTER — Encounter: Payer: Self-pay | Admitting: Internal Medicine

## 2017-08-13 VITALS — BP 112/78 | HR 103 | Temp 98.0°F | Wt 140.0 lb

## 2017-08-13 DIAGNOSIS — Z0000 Encounter for general adult medical examination without abnormal findings: Secondary | ICD-10-CM

## 2017-08-13 NOTE — Patient Instructions (Signed)
It was nice meeting you today Brian Cross!  If you have any questions or concerns, please feel free to call the clinic.   Be well,  Dr. Natale MilchLancaster

## 2017-08-13 NOTE — Progress Notes (Signed)
Note entered in error.   Brian AbernethyAbigail J Jamerion Cabello, MD, MPH PGY-3 [facility] Pager [phone]

## 2017-09-01 ENCOUNTER — Encounter: Payer: Self-pay | Admitting: Internal Medicine

## 2017-09-01 ENCOUNTER — Ambulatory Visit (INDEPENDENT_AMBULATORY_CARE_PROVIDER_SITE_OTHER): Payer: BLUE CROSS/BLUE SHIELD | Admitting: Internal Medicine

## 2017-09-01 VITALS — BP 125/70 | HR 78 | Temp 98.3°F | Ht 63.0 in | Wt 142.0 lb

## 2017-09-01 DIAGNOSIS — E781 Pure hyperglyceridemia: Secondary | ICD-10-CM

## 2017-09-01 DIAGNOSIS — R200 Anesthesia of skin: Secondary | ICD-10-CM

## 2017-09-01 DIAGNOSIS — Z114 Encounter for screening for human immunodeficiency virus [HIV]: Secondary | ICD-10-CM

## 2017-09-01 DIAGNOSIS — R202 Paresthesia of skin: Secondary | ICD-10-CM | POA: Diagnosis not present

## 2017-09-01 DIAGNOSIS — Z0001 Encounter for general adult medical examination with abnormal findings: Secondary | ICD-10-CM | POA: Diagnosis not present

## 2017-09-01 HISTORY — DX: Anesthesia of skin: R20.0

## 2017-09-01 NOTE — Progress Notes (Signed)
Brian Cross is a 27 y.o. male presents to office today for annual physical exam examination.  Concerns today include:  1. Tingling in fingers  - states he has had tingling for 2 -3 weeks  - states he has tingling in his finger tips - no associated symptoms - nothing makes it worse or better   -  Feels it more at night, when he thinks about it he can feel it more  -  Patient denies any raynauds  - states he noted some weakness in his thumb with pippetting  - No fevers, no chills, no weight loss, no rash  2. Bump on chest  - several years -  when he touches it is painful  -  Feels like it is an acne bump   Last eye exam: eye exam  Last dental exam: last year  Immunizations needed: Had TDAP in [place] last year   Men's Health  Sexual activity: None STD Screening: Was sexually active about 2 years ago.  Denies any symptoms.  Not interested in STD screening other than HIV and RPR Exercise: Not recently Diet: Eats plenty of fruits and vegetables Smoking: None, vapes  Alcohol: None  Drugs: THC+  Mood: feeling anxious at times however denies affecting his work or home life. Dentist: Not seen the dentist in 1 year    No past medical history on file. Social History   Socioeconomic History  . Marital status: Single    Spouse name: Not on file  . Number of children: Not on file  . Years of education: Not on file  . Highest education level: Not on file  Social Needs  . Financial resource strain: Not on file  . Food insecurity - worry: Not on file  . Food insecurity - inability: Not on file  . Transportation needs - medical: Not on file  . Transportation needs - non-medical: Not on file  Occupational History  . Not on file  Tobacco Use  . Smoking status: Former Smoker    Packs/day: 0.50    Types: Cigarettes    Last attempt to quit: 11/22/2015    Years since quitting: 1.7  . Smokeless tobacco: Never Used  . Tobacco comment: 1 pack/week  Substance and Sexual Activity    . Alcohol use: No    Alcohol/week: 0.0 oz  . Drug use: No  . Sexual activity: Not on file  Other Topics Concern  . Not on file  Social History Narrative  . Not on file   No past surgical history on file. No family history on file.  ROS: Review of Systems Review of Systems  Constitutional: Negative for chills and fever.  HENT: Negative for congestion and sore throat.   Respiratory: Negative for cough and sputum production.   Cardiovascular: Negative for chest pain and leg swelling.  Gastrointestinal: Negative for nausea and vomiting.  Genitourinary: Negative for dysuria and urgency.  Musculoskeletal: Negative for myalgias and neck pain.  Skin: Negative for rash.  Neurological: Positive for tingling. Negative for sensory change and focal weakness.  Psychiatric/Behavioral: Negative for depression and suicidal ideas.    Physical exam Physical Exam  Constitutional: He is oriented to person, place, and time. He appears well-developed and well-nourished.  HENT:  Head: Normocephalic and atraumatic.  Eyes: EOM are normal. Pupils are equal, round, and reactive to light.  Neck: Normal range of motion. Neck supple.  Cardiovascular: Normal rate, regular rhythm and normal heart sounds.  Pulmonary/Chest: Effort normal and breath sounds normal.  Abdominal: Soft.  Musculoskeletal: Normal range of motion.  Neurological: He is alert and oriented to person, place, and time.  Skin: Skin is warm.  Less than 5 mm papular lesion consistent with acne  Psychiatric: He has a normal mood and affect.     Assessment/ Plan: Patient with tingling in bilateral hands here for annual physical exam.   Numbness and tingling in both hands Indicates bilateral fingertip numbness and tingling that has been going on for about 2 weeks.  Given the fact that there is no neck or shoulder pain unlikely coming from his spine.  Unlikely any other metabolic concerns given lack of heat or cold intolerance.  Patient  is not overweight so unlikely diabetes.  He states that he is sometimes anxious and when he thinks about it it seems to get worse could be due to anxiety.  Given the fact that it is along all fingertips unlikely carpal tunnel.  Patient without any other autoimmune signs no concern for rayunauds.  Therefore unsure of etiology.  Will do some initial lab workup - CMP14+EGFR - CBC - TSH - RPR -If no improvement in two weeks, would consider sending patient for EMG and possible neurology consult  Hypertriglyceridemia Screening for elevated lipids   Brian Cross PGY-3, [place]

## 2017-09-01 NOTE — Patient Instructions (Signed)
Nice seeing you today.  If you are still having problems with numbness and tingling please follow-up with me in 2 weeks.  Your blood work will either come in the mail or I will give you call that if there are any concerns.  Otherwise assuming things resolve on their own you can follow-up with me in 1 year

## 2017-09-02 LAB — CMP14+EGFR
ALK PHOS: 79 IU/L (ref 39–117)
ALT: 39 IU/L (ref 0–44)
AST: 30 IU/L (ref 0–40)
Albumin/Globulin Ratio: 1.6 (ref 1.2–2.2)
Albumin: 4.9 g/dL (ref 3.5–5.5)
BUN/Creatinine Ratio: 9 (ref 9–20)
BUN: 8 mg/dL (ref 6–20)
Bilirubin Total: 0.7 mg/dL (ref 0.0–1.2)
CO2: 27 mmol/L (ref 20–29)
Calcium: 10 mg/dL (ref 8.7–10.2)
Chloride: 102 mmol/L (ref 96–106)
Creatinine, Ser: 0.89 mg/dL (ref 0.76–1.27)
GFR calc Af Amer: 137 mL/min/{1.73_m2} (ref >59)
GFR calc non Af Amer: 119 mL/min/{1.73_m2} (ref >59)
Globulin, Total: 3 g/dL (ref 1.5–4.5)
Glucose: 92 mg/dL (ref 65–99)
POTASSIUM: 4.1 mmol/L (ref 3.5–5.2)
SODIUM: 144 mmol/L (ref 134–144)
Total Protein: 7.9 g/dL (ref 6.0–8.5)

## 2017-09-02 LAB — CBC
HEMOGLOBIN: 15.4 g/dL (ref 13.0–17.7)
Hematocrit: 44.8 % (ref 37.5–51.0)
MCH: 28.6 pg (ref 26.6–33.0)
MCHC: 34.4 g/dL (ref 31.5–35.7)
MCV: 83 fL (ref 79–97)
PLATELETS: 231 10*3/uL (ref 150–379)
RBC: 5.39 x10E6/uL (ref 4.14–5.80)
RDW: 12.8 % (ref 12.3–15.4)
WBC: 4.8 10*3/uL (ref 3.4–10.8)

## 2017-09-02 LAB — LIPID PANEL
Chol/HDL Ratio: 4.8 ratio (ref 0.0–5.0)
Cholesterol, Total: 198 mg/dL (ref 100–199)
HDL: 41 mg/dL (ref >39)
LDL Calculated: 129 mg/dL — ABNORMAL HIGH (ref 0–99)
TRIGLYCERIDES: 142 mg/dL (ref 0–149)
VLDL Cholesterol Cal: 28 mg/dL (ref 5–40)

## 2017-09-02 LAB — RPR: RPR Ser Ql: NONREACTIVE

## 2017-09-02 LAB — TSH: TSH: 1.32 u[IU]/mL (ref 0.450–4.500)

## 2017-09-02 LAB — HIV ANTIBODY (ROUTINE TESTING W REFLEX): HIV Screen 4th Generation wRfx: NONREACTIVE

## 2017-09-02 NOTE — Assessment & Plan Note (Signed)
Screening for elevated lipids

## 2017-09-02 NOTE — Assessment & Plan Note (Signed)
Indicates bilateral fingertip numbness and tingling that has been going on for about 2 weeks.  Given the fact that there is no neck or shoulder pain unlikely coming from his spine.  Unlikely any other metabolic concerns given lack of heat or cold intolerance.  Patient is not overweight so unlikely diabetes.  He states that he is sometimes anxious and when he thinks about it it seems to get worse could be due to anxiety.  Given the fact that it is along all fingertips unlikely carpal tunnel.  Patient without any other autoimmune signs no concern for rayunauds.  Therefore unsure of etiology.  Will do some initial lab workup - CMP14+EGFR - CBC - TSH - RPR -If no improvement in two weeks, would consider sending patient for EMG and possible neurology consult

## 2017-09-04 ENCOUNTER — Encounter: Payer: Self-pay | Admitting: Internal Medicine

## 2017-10-20 ENCOUNTER — Ambulatory Visit: Payer: BLUE CROSS/BLUE SHIELD | Admitting: Internal Medicine

## 2017-10-20 ENCOUNTER — Ambulatory Visit (INDEPENDENT_AMBULATORY_CARE_PROVIDER_SITE_OTHER): Payer: BLUE CROSS/BLUE SHIELD

## 2017-10-20 DIAGNOSIS — Z111 Encounter for screening for respiratory tuberculosis: Secondary | ICD-10-CM | POA: Diagnosis not present

## 2017-10-20 NOTE — Progress Notes (Signed)
Patient was given PPD test on the right forearm. Patient tolerated it well. Patient was told to come back for a reading/result on 10/22/2017. Patient was explained to that if he does not come in on time he will have to repeat.Brian Cross, Brian Cross, CMA

## 2017-10-22 ENCOUNTER — Ambulatory Visit (INDEPENDENT_AMBULATORY_CARE_PROVIDER_SITE_OTHER): Payer: BLUE CROSS/BLUE SHIELD

## 2017-10-22 DIAGNOSIS — Z111 Encounter for screening for respiratory tuberculosis: Secondary | ICD-10-CM

## 2017-10-22 LAB — TB SKIN TEST
Induration: 0 mm
TB Skin Test: NEGATIVE

## 2017-10-22 NOTE — Progress Notes (Signed)
   Patient here today to have PPD site read.   PPD read and results entered in Epic. Result: 0 mm induration. Interpretation: Negative Alisa Brake, RN ([facility] RN)    

## 2018-06-25 ENCOUNTER — Encounter: Payer: Self-pay | Admitting: Family Medicine

## 2018-06-25 ENCOUNTER — Ambulatory Visit: Payer: BLUE CROSS/BLUE SHIELD | Admitting: Family Medicine

## 2018-06-25 VITALS — BP 116/72 | HR 62 | Temp 99.1°F | Wt 149.4 lb

## 2018-06-25 DIAGNOSIS — E781 Pure hyperglyceridemia: Secondary | ICD-10-CM

## 2018-06-25 DIAGNOSIS — Z0130 Encounter for examination of blood pressure without abnormal findings: Secondary | ICD-10-CM

## 2018-06-25 DIAGNOSIS — Z0000 Encounter for general adult medical examination without abnormal findings: Secondary | ICD-10-CM | POA: Insufficient documentation

## 2018-06-25 NOTE — Progress Notes (Signed)
Subjective:    Patient ID: Brian Cross, male    DOB: [birth year], 27 y.o.   MRN: [id number]   CC: Concern for high blood pressure  HPI: [name] is a 27 year old gentleman that presented to discuss the following:  High blood pressure: Patient states that he was told during 2 separate dentist appointments about 1 month ago that his blood pressure was elevated to about 180s systolic.  Prior to having his blood pressure measured, he had smoked a cigarette on both occasions.  Blood pressures measurements were also taken while the patient was lying down flat with his arm hanging off the side of the chair, taken with an automatic BP cuff.  No significant past medical history, with the exception of previous smoker and hypertriglyceridemia.  Family history significant for mother with hypertension, unknown chronicity.  Since being told his blood pressure is high, the patient has been very anxious about his health.  He gave up daily E cigarette smoking (now only occasionally when his friends have their e-cigarette during social events), started exercising, and started adding more vegetables/less junk food to his diet.  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  He denies any recurrent headaches, dizziness, lightheadedness, blurred vision, shortness of breath, chest pain, abdominal pain, or numbness/tingling.    Smoking status reviewed  Review of Systems Per HPI, also denies recent illness, fever, headache, changes in vision, chest pain, shortness of breath, abdominal pain, N/V/D, weakness   Patient Active Problem List   Diagnosis Date Noted  . Examination of blood pressure 06/25/2018  . Numbness and tingling in both hands 09/01/2017  . GERD (gastroesophageal reflux disease) 12/13/2015  . Tobacco abuse counseling 02/02/2013  . Tinea pedis of left foot 01/29/2013  . 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  Hypertriglyceridemia 03/27/2012  . OTHER SEBORRHEIC DERMATITIS 03/23/2008  . ACNE NEC 01/20/2007     Objective:  BP 116/72   Pulse 62   Temp 99.1 F (37.3 C)   Wt 149 lb 6.4 oz (67.8 kg)   SpO2 99%   BMI 26.47 kg/m  Vitals and nursing note reviewed  BP checked at the end of visit, 124/70.   General: NAD, pleasant younger gentleman Cardiac: RRR, normal heart sounds, no murmurs Respiratory: CTAB, normal effort Abdomen: soft, nontender, nondistended Extremities: no edema or cyanosis. WWP. Skin: warm and dry, no rashes noted Neuro: alert and oriented, no focal deficits, 5/5 muscle strength upper and lower extremities, EOMI, PERRL.  Psych: normal affect, anxious appearing  Assessment & Plan:   Examination of blood pressure BP in normal limits x2 in the office today.  Likely would believe his elevated pressures in the dentist may have been incorrect, likely multifactorial to malpositioning, incorrect cuff placement/inaccurate automatic cuff, and smoked a cigarette immediately prior to.  However, could also consider that may be his blood pressure was slightly elevated at time and with his lifestyle modifications currently, his blood pressures improved. -Reassured the patient that his blood pressure is normal -Do not believe that he needs to be monitoring outpatient, however recommended he can check his blood pressure at the [employer] cuff stands as patient is wanting to continue watching his BP -Provided education on appropriate blood pressure levels, return to clinic if he documents consistently elevated BP during his normal activity and anxiety levels -Recommended continued physical activity, practicing well-balanced diet, and abstaining from e-cigarette use  Hypertriglyceridemia Patient has history of hypertriglyceridemia into the  400s.  Was on atorvastatin for quite some time, has not been taking any medications for at least the last 6 months. - Recheck lipid panel to evaluate LDL  and triglycerides-has only eaten a handful of Chex mix prior to this today - Further therapy dependent on return of results  Follow-up as needed.   Leticia PennaSamantha Papa [place], DO Family Medicine Resident PGY-1

## 2018-06-25 NOTE — Assessment & Plan Note (Signed)
BP in normal limits x2 in the office today.  Likely would believe his elevated pressures in the dentist may have been incorrect, likely multifactorial to malpositioning, incorrect cuff placement/inaccurate automatic cuff, and smoked a cigarette immediately prior to.  However, could also consider that may be his blood pressure was slightly elevated at time and with his lifestyle modifications currently, his blood pressures improved. -Reassured the patient that his blood pressure is normal -Do not believe that he needs to be monitoring outpatient, however recommended he can check his blood pressure at the [employer] cuff stands as patient is wanting to continue watching his BP -Provided education on appropriate blood pressure levels, return to clinic if he documents consistently elevated BP during his normal activity and anxiety levels -Recommended continued physical activity, practicing well-balanced diet, and abstaining from e-cigarette use

## 2018-06-25 NOTE — Patient Instructions (Signed)
It was so nice meeting you today, thank you for coming in to get your blood pressure checked.  While you are here we checked your blood pressure twice, both within normal limits. I'm so excited that in the last few months use increase your exercise, work on your diet, and stop e-cigarettes.  I recommend you continue doing this.  Also make sure you drink plenty of water to stay hydrated.  I have ordered a lipid panel today to look your triglycerides as this had been high before, I will let you know those results likely in the mail in the next 1 to 2 weeks.  I hope you have a wonderful holiday!

## 2018-06-25 NOTE — Assessment & Plan Note (Signed)
Patient has history of hypertriglyceridemia into the 400s.  Was on atorvastatin for quite some time, has not been taking any medications for at least the last 6 months. - Recheck lipid panel to evaluate LDL and triglycerides-has only eaten a handful of Chex mix prior to this today

## 2018-06-26 LAB — LIPID PANEL
CHOL/HDL RATIO: 6.2 ratio — AB (ref 0.0–5.0)
Cholesterol, Total: 249 mg/dL — ABNORMAL HIGH (ref 100–199)
HDL: 40 mg/dL (ref >39)
LDL CALC: 173 mg/dL — AB (ref 0–99)
Triglycerides: 180 mg/dL — ABNORMAL HIGH (ref 0–149)
VLDL Cholesterol Cal: 36 mg/dL (ref 5–40)

## 2018-07-10 ENCOUNTER — Telehealth: Payer: Self-pay | Admitting: Family Medicine

## 2018-07-10 ENCOUNTER — Other Ambulatory Visit: Payer: Self-pay | Admitting: Family Medicine

## 2018-07-10 DIAGNOSIS — E782 Mixed hyperlipidemia: Secondary | ICD-10-CM

## 2018-07-10 DIAGNOSIS — E781 Pure hyperglyceridemia: Secondary | ICD-10-CM

## 2018-07-10 MED ORDER — ATORVASTATIN CALCIUM 40 MG PO TABS: 40.0000 mg | ORAL_TABLET | Freq: Every day | ORAL | 0 refills | Status: DC

## 2018-07-10 NOTE — Telephone Encounter (Signed)
Patient called and notified of lipid panel results.  Previously was taking atorvastatin 40 mg for hypercholesterolemia/hypertriglyceridemia.  Tolerated this medication well.  Will restart atorvastatin 40 mg and send prescription to [facility] today.  Patient to also call clinic to schedule a follow-up appointment in about 2 months, to monitor LDL and medication tolerability.  He agrees with plan and voices understanding.  Allayne Stack, DO

## 2018-07-21 ENCOUNTER — Other Ambulatory Visit: Payer: Self-pay

## 2018-07-21 ENCOUNTER — Other Ambulatory Visit (HOSPITAL_COMMUNITY)
Admission: RE | Admit: 2018-07-21 | Discharge: 2018-07-21 | Disposition: A | Discharge status: Home / Self Care | Payer: BLUE CROSS/BLUE SHIELD | Source: Ambulatory Visit | Attending: Family Medicine | Admitting: Family Medicine

## 2018-07-21 ENCOUNTER — Ambulatory Visit (INDEPENDENT_AMBULATORY_CARE_PROVIDER_SITE_OTHER): Payer: BLUE CROSS/BLUE SHIELD | Admitting: Family Medicine

## 2018-07-21 VITALS — BP 120/54 | HR 60 | Temp 98.5°F | Wt 145.4 lb

## 2018-07-21 DIAGNOSIS — N50812 Left testicular pain: Secondary | ICD-10-CM | POA: Insufficient documentation

## 2018-07-21 DIAGNOSIS — Z202 Contact with and (suspected) exposure to infections with a predominantly sexual mode of transmission: Secondary | ICD-10-CM

## 2018-07-21 LAB — POCT URINALYSIS DIP (MANUAL ENTRY)
BILIRUBIN UA: NEGATIVE
Glucose, UA: NEGATIVE mg/dL
Ketones, POC UA: NEGATIVE mg/dL
Leukocytes, UA: NEGATIVE
Nitrite, UA: NEGATIVE
Protein Ur, POC: NEGATIVE mg/dL
RBC UA: NEGATIVE
SPEC GRAV UA: 1.025 (ref 1.010–1.025)
UROBILINOGEN UA: 0.2 U/dL
pH, UA: 5.5 (ref 5.0–8.0)

## 2018-07-21 NOTE — Assessment & Plan Note (Signed)
Patient presenting with left testicular pain after masturbating.  No signs of discharge or infection in history. Normal appearance on exam. UA negative making UA unlikely.  No pyuria, making epididymitis unlikely.  GC/chlamydia urine pending.    Will pursue ultrasound to further evaluate for occult hydrocele, varicocele or other structural not seen on exam.  Torsion not expected given normal parental exam overall well-appearing.  However ultrasound will also evaluate for this.  Recommended NSAIDs PRN for pain.  Patient follow-up if worsens.

## 2018-07-21 NOTE — Progress Notes (Signed)
    Subjective:  Brian Cross is a 28 y.o. male who presents to the [facility] today with a chief complaint of left testicle pain.   HPI:  Patient presenting with 2 days of testicular pain in his left testicle.  It started after masturbating.  It happens every 30 minutes.  It will last for few seconds.  Patient is not taking anything for it.  He denies any fevers, chills, urinary frequency, unintentional weight loss, history of testicular cancer, being sexually active, penile discharge, blood in the semen, blood in his urine, diarrhea, back pain, nausea, vomiting.  Patient is a Retail buyer major at school.  He is very concerned that he may have cancer.  Denies feeling any lumps in his testicles.  Denies any testicular trauma.  ROS: Per HPI   Objective:  Physical Exam: BP (!) 120/54   Pulse 60   Temp 98.5 F (36.9 C) (Oral)   Wt 145 lb 6 oz (65.9 kg)   SpO2 98%   BMI 25.75 kg/m   Gen: NAD, resting comfortably GU: Normal-appearing penis without skin lesions, normal-appearing testicles without mass, left hanging lower than right, normal meatus, nontender, normal lie,  Results for orders placed or performed in visit on 07/21/18 (from the past 72 hour(s))  POCT urinalysis dipstick     Status: None   Collection Time: 07/21/18  1:45 PM  Result Value Ref Range   Color, UA yellow yellow   Clarity, UA clear clear   Glucose, UA negative negative mg/dL   Bilirubin, UA negative negative   Ketones, POC UA negative negative mg/dL   Spec Grav, UA 9.381 8.299 - 1.025   Blood, UA negative negative   pH, UA 5.5 5.0 - 8.0   Protein Ur, POC negative negative mg/dL   Urobilinogen, UA 0.2 0.2 or 1.0 E.U./dL   Nitrite, UA Negative Negative   Leukocytes, UA Negative Negative     Assessment/Plan:  Testicular pain, left Patient presenting with left testicular pain after masturbating.  No signs of discharge or infection in history. Normal appearance on exam. UA negative making UA unlikely.  No pyuria, making  epididymitis unlikely.  GC/chlamydia urine pending.    Will pursue ultrasound to further evaluate for occult hydrocele, varicocele or other structural not seen on exam.  Torsion not expected given normal parental exam overall well-appearing.  However ultrasound will also evaluate for this.  Recommended NSAIDs PRN for pain.  Patient follow-up if worsens.    Lab Orders     POCT urinalysis dipstick     POCT urinalysis dipstick  No orders of the defined types were placed in this encounter.     Thomes Dinning, MD, MS FAMILY MEDICINE RESIDENT - PGY2 07/21/2018 2:03 PM

## 2018-07-21 NOTE — Patient Instructions (Signed)
Your evaluated for testicular pain, we have tested you for urinary tract infection and possible exposure to STDs.  Please go to your scheduled ultrasound.  We will call you with the test results and provide treatment based on the findings.

## 2018-07-22 LAB — URINE CYTOLOGY ANCILLARY ONLY
CHLAMYDIA, DNA PROBE: NEGATIVE
Neisseria Gonorrhea: NEGATIVE

## 2018-07-24 ENCOUNTER — Ambulatory Visit (HOSPITAL_COMMUNITY): Payer: BLUE CROSS/BLUE SHIELD

## 2018-09-16 ENCOUNTER — Ambulatory Visit: Payer: BLUE CROSS/BLUE SHIELD | Admitting: Family Medicine

## 2018-11-12 ENCOUNTER — Encounter: Payer: Self-pay | Admitting: Family Medicine

## 2018-11-19 ENCOUNTER — Other Ambulatory Visit: Payer: Self-pay | Admitting: *Deleted

## 2018-11-19 DIAGNOSIS — E782 Mixed hyperlipidemia: Secondary | ICD-10-CM

## 2018-11-19 MED ORDER — ATORVASTATIN CALCIUM 40 MG PO TABS: 40.0000 mg | ORAL_TABLET | Freq: Every day | ORAL | 3 refills | Status: AC

## 2018-12-16 ENCOUNTER — Ambulatory Visit: Payer: BLUE CROSS/BLUE SHIELD | Admitting: Family Medicine

## 2019-12-14 NOTE — Progress Notes (Signed)
    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 Patient has lived in [place] his whole life. Has been UTD on shots up till now. States he may have gotten 1 vaccine in [place] but is unsure which vaccine it was or when. States he needs to be uptodate on vaccinations prior to starting dental school. Patient also needs proof of titers.      PERTINENT  PMH / PSH: none  OBJECTIVE:   BP 120/72   Pulse 77   Wt 144 lb 3.2 oz (65.4 kg)   SpO2 98%   BMI 25.54 kg/m   Gen: awake and alert, NAD Resp: speaking full sentences, no increased WOB  ASSESSMENT/PLAN:   Healthcare maintenance Vaccinations updates. Patient was UTD other than TDAP and varicella. Vaccinations updated today and proof of vaccinations provided to patients. Hep B titers and quant gold ordered. Patient thinks he may have received Tdap in [place] but is unsure. Shared decision making with patient. 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 Lab closed so lab appointment scheduled for 6/10.      Oralia Manis, DO [facility]

## 2019-12-15 ENCOUNTER — Encounter: Payer: Self-pay | Admitting: Family Medicine

## 2019-12-15 ENCOUNTER — Other Ambulatory Visit: Payer: Self-pay

## 2019-12-15 ENCOUNTER — Ambulatory Visit (INDEPENDENT_AMBULATORY_CARE_PROVIDER_SITE_OTHER): Payer: BLUE CROSS/BLUE SHIELD | Admitting: Family Medicine

## 2019-12-15 VITALS — BP 120/72 | HR 77 | Wt 144.2 lb

## 2019-12-15 DIAGNOSIS — Z23 Encounter for immunization: Secondary | ICD-10-CM

## 2019-12-15 DIAGNOSIS — Z1159 Encounter for screening for other viral diseases: Secondary | ICD-10-CM

## 2019-12-15 DIAGNOSIS — Z0000 Encounter for general adult medical examination without abnormal findings: Secondary | ICD-10-CM | POA: Diagnosis not present

## 2019-12-15 DIAGNOSIS — Z111 Encounter for screening for respiratory tuberculosis: Secondary | ICD-10-CM | POA: Diagnosis not present

## 2019-12-15 NOTE — Patient Instructions (Addendum)
1. We will give you a copy of your shot records 2. Schedule a lab appointment to obtain these labs

## 2019-12-15 NOTE — Assessment & Plan Note (Signed)
Vaccinations updates. Patient was UTD other than TDAP and varicella. Vaccinations updated today and proof of vaccinations provided to patients. Hep B titers and quant gold ordered. Patient thinks he may have received Tdap in [place] but is unsure. Shared decision making with patient. 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 Lab closed so lab appointment scheduled for 6/10.

## 2019-12-16 ENCOUNTER — Other Ambulatory Visit: Payer: BLUE CROSS/BLUE SHIELD

## 2019-12-16 DIAGNOSIS — Z1159 Encounter for screening for other viral diseases: Secondary | ICD-10-CM

## 2019-12-16 DIAGNOSIS — Z111 Encounter for screening for respiratory tuberculosis: Secondary | ICD-10-CM

## 2019-12-17 ENCOUNTER — Telehealth: Payer: Self-pay | Admitting: Family Medicine

## 2019-12-17 NOTE — Telephone Encounter (Signed)
Patient Heb B non immune. Will need to have booster dose and re-check titers in 6 weeks. Informed patient to schedule RN visit on 6/14 for booster vaccine. Should see physician in 4-6 weeks to recheck titer.   If still still low titer, will need the second series, then check titer again in 4 weeks. If still un-immune, will need the whole three series. If after three series, still nonimmune, then consider patient a non-Heb B immune responder.   Discussed with Dr. Lum Babe

## 2019-12-19 LAB — QUANTIFERON-TB GOLD PLUS
QuantiFERON Mitogen Value: >10 IU/mL
QuantiFERON Nil Value: 0.25 IU/mL
QuantiFERON TB1 Ag Value: 0.16 IU/mL
QuantiFERON TB2 Ag Value: 0.21 IU/mL
QuantiFERON-TB Gold Plus: NEGATIVE

## 2019-12-19 LAB — HEPATITIS B SURFACE ANTIBODY, QUANTITATIVE: Hepatitis B Surf Ab Quant: <3.1 m[IU]/mL — ABNORMAL LOW (ref >9.9)

## 2019-12-20 ENCOUNTER — Other Ambulatory Visit: Payer: Self-pay

## 2019-12-20 ENCOUNTER — Ambulatory Visit (INDEPENDENT_AMBULATORY_CARE_PROVIDER_SITE_OTHER): Payer: BLUE CROSS/BLUE SHIELD

## 2019-12-20 DIAGNOSIS — Z23 Encounter for immunization: Secondary | ICD-10-CM

## 2019-12-20 NOTE — Progress Notes (Signed)
Patient reports to nurse clinic for Hep B vaccination. Injection given in LD, site unremarkable. Per Dr. Darin Engels, patient is to receive initial injection and follow up in 6 weeks for Hep B titer. These results will determine if patient will proceed with Hep B series.   Veronda Prude, RN

## 2019-12-21 ENCOUNTER — Ambulatory Visit (INDEPENDENT_AMBULATORY_CARE_PROVIDER_SITE_OTHER): Payer: BLUE CROSS/BLUE SHIELD

## 2019-12-21 DIAGNOSIS — Z111 Encounter for screening for respiratory tuberculosis: Secondary | ICD-10-CM

## 2019-12-21 NOTE — Progress Notes (Signed)
Tuberculin skin test applied to left ventral forearm. Patient to return on 12/23/2019 to have site read.

## 2019-12-23 ENCOUNTER — Other Ambulatory Visit: Payer: Self-pay

## 2019-12-23 ENCOUNTER — Ambulatory Visit (INDEPENDENT_AMBULATORY_CARE_PROVIDER_SITE_OTHER): Payer: BLUE CROSS/BLUE SHIELD

## 2019-12-23 DIAGNOSIS — Z111 Encounter for screening for respiratory tuberculosis: Secondary | ICD-10-CM

## 2019-12-23 LAB — TB SKIN TEST
Induration: 0 mm
TB Skin Test: NEGATIVE

## 2019-12-23 NOTE — Progress Notes (Signed)
Patient is here for a PPD read.  It was placed on 12/21/2019 in the left forearm @ 0855 am.  Site unremarkable  PPD RESULTS:  Result: negative Induration: 0 mm  Letter created and given to patient for documentation purposes. Veronda Prude, RN

## 2020-01-24 ENCOUNTER — Telehealth: Payer: Self-pay

## 2020-01-24 NOTE — Telephone Encounter (Signed)
Patient calls nurse line to schedule appointment for Hep B titer. Per Dr. Darin Engels, patient was supposed to receive initial Hep B injection and follow back up in 6 weeks for Hep B Titer. Future order will need to be placed for titer. Patient has scheduled for lab next Monday, 7/26 at 11am.   To PCP  Veronda Prude, RN

## 2020-01-25 ENCOUNTER — Other Ambulatory Visit: Payer: Self-pay | Admitting: Family Medicine

## 2020-01-25 DIAGNOSIS — Z789 Other specified health status: Secondary | ICD-10-CM

## 2020-01-25 NOTE — Telephone Encounter (Signed)
Future hepatitis B surface antibody lab order placed.  Thank you!  Allayne Stack, DO

## 2020-01-31 ENCOUNTER — Other Ambulatory Visit: Payer: BLUE CROSS/BLUE SHIELD

## 2020-01-31 ENCOUNTER — Other Ambulatory Visit: Payer: Self-pay

## 2020-01-31 DIAGNOSIS — Z789 Other specified health status: Secondary | ICD-10-CM

## 2020-02-01 LAB — HEPATITIS B SURFACE ANTIBODY,QUALITATIVE: Hep B Surface Ab, Qual: REACTIVE

## 2020-02-25 ENCOUNTER — Telehealth: Payer: Self-pay | Admitting: Family Medicine

## 2020-02-25 NOTE — Telephone Encounter (Signed)
Opened in error

## 2021-12-11 ENCOUNTER — Encounter: Payer: Self-pay | Admitting: *Deleted
